# Patient Record
Sex: Male | Born: 1955 | Race: White | Hispanic: No | Marital: Married | Smoking: Never smoker
Health system: Southern US, Community
[De-identification: ages and names within clinical notes are randomized; demographics above are authoritative.]

---

## 2012-04-04 ENCOUNTER — Emergency Department: Payer: Self-pay | Admitting: Emergency Medicine

## 2013-11-26 IMAGING — CR RIGHT THUMB 2+V
1 series · 3 of 3 positions shown · non-contrast
Comparison: none

REASON FOR EXAM: lac
COMMENTS:

PROCEDURE:     DXR - DXR THUMB RIGHT HAND (1ST DIGIT)  - April 04, 2012  [DATE]
RESULT:     A non-displaced fracture is identified involving the distal tuft
of the thumb.

[Series 1: pa · 0.17mm/px · 3 of 3 slices shown]
[im 1/3]
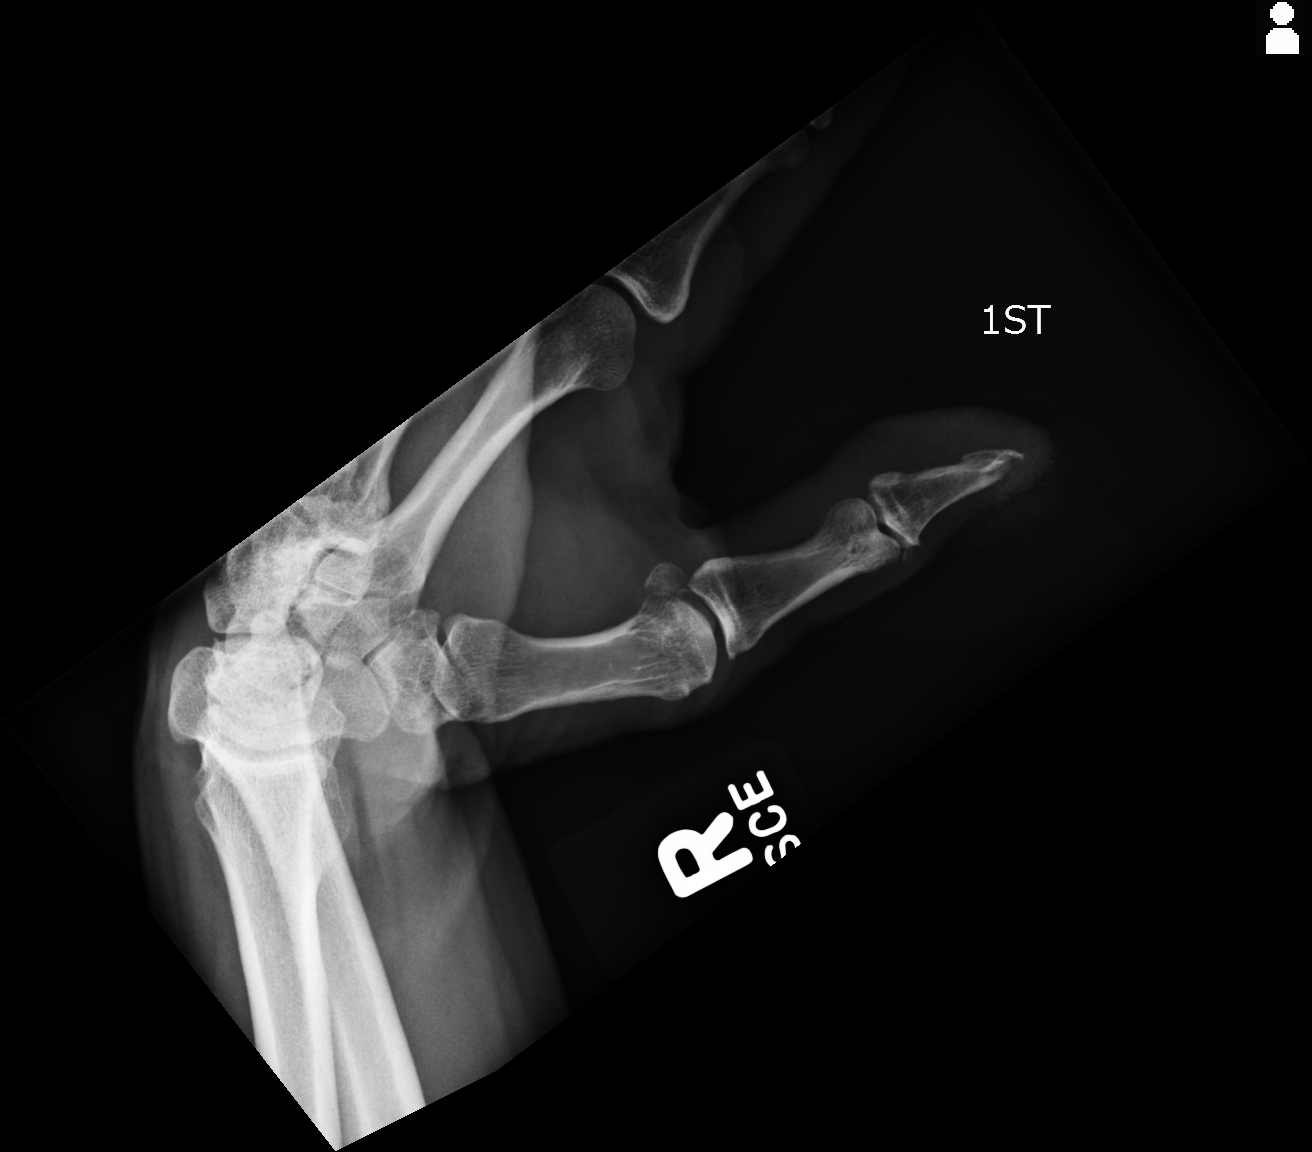
[im 2/3]
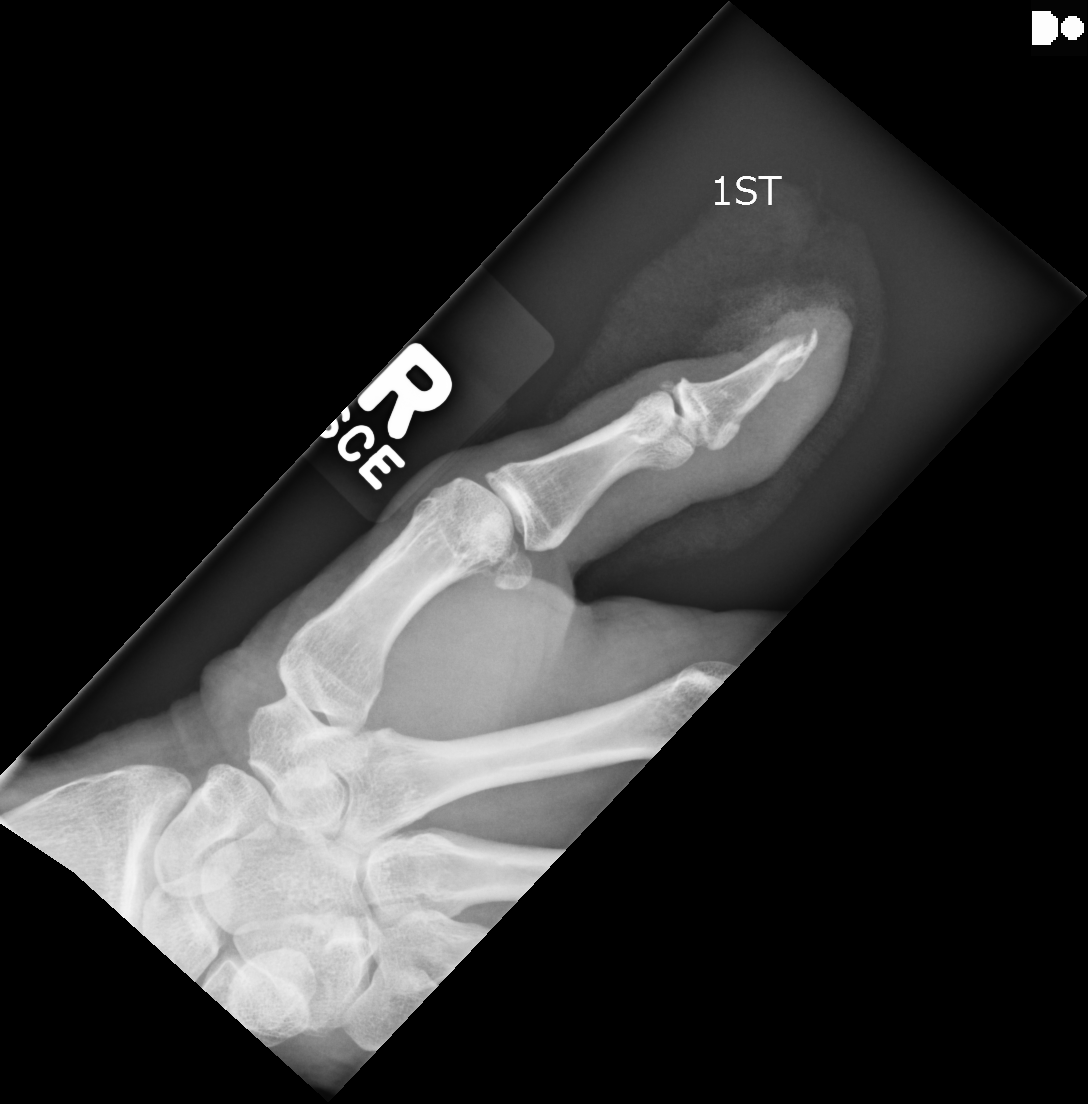
[im 3/3]
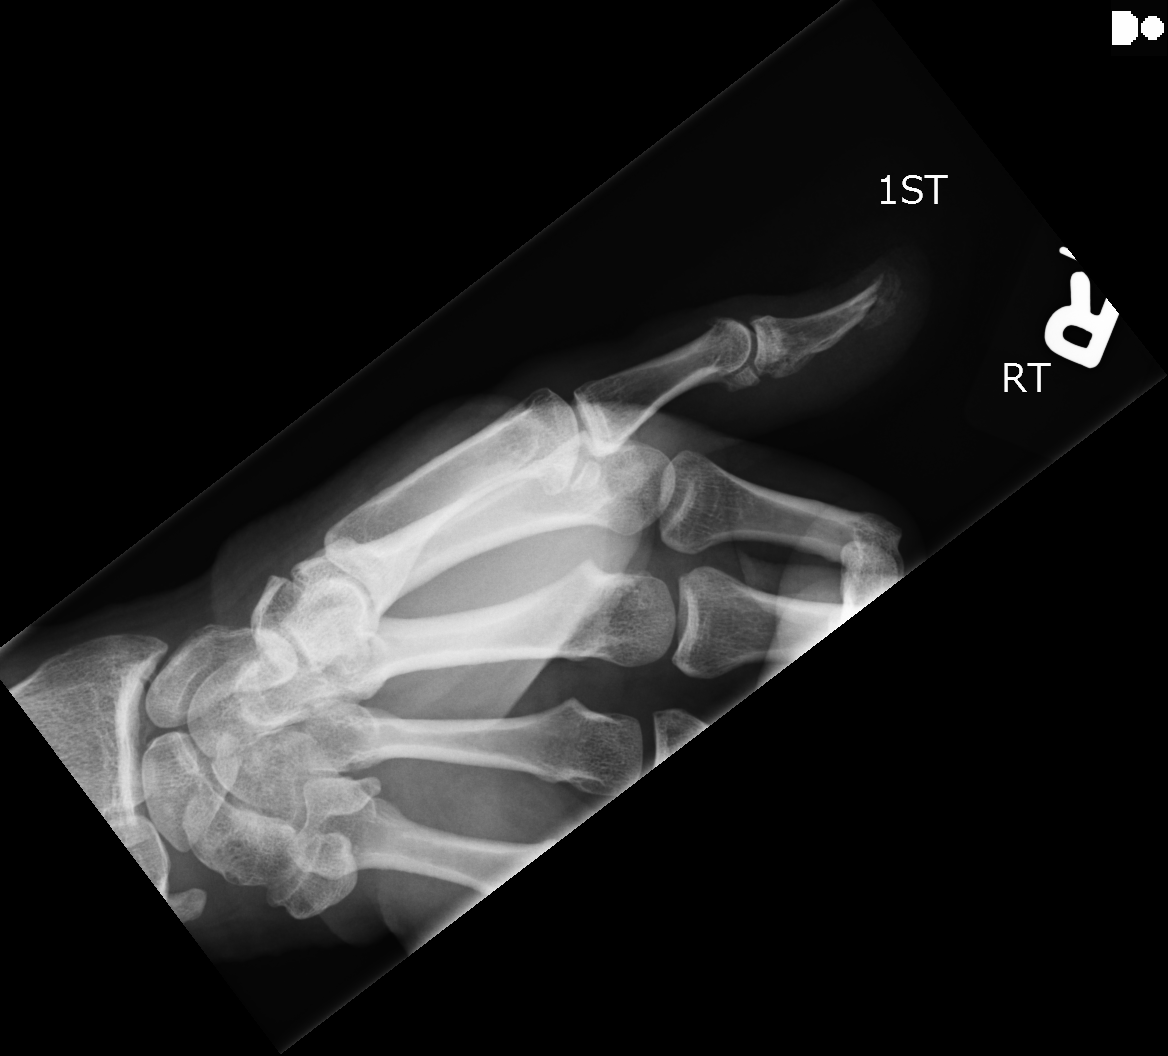

[3 of 3 positions shown; findings below may reference images not displayed]

IMPRESSION: Tuft fracture as described above.

## 2017-01-29 ENCOUNTER — Emergency Department: Payer: Managed Care, Other (non HMO)

## 2017-01-29 ENCOUNTER — Emergency Department
Admission: EM | Admit: 2017-01-29 | Discharge: 2017-01-29 | Disposition: A | Discharge status: Home / Self Care | Payer: Managed Care, Other (non HMO) | Attending: Emergency Medicine | Admitting: Emergency Medicine

## 2017-01-29 ENCOUNTER — Encounter: Payer: Self-pay | Admitting: Emergency Medicine

## 2017-01-29 DIAGNOSIS — Y998 Other external cause status: Secondary | ICD-10-CM | POA: Insufficient documentation

## 2017-01-29 DIAGNOSIS — S62522B Displaced fracture of distal phalanx of left thumb, initial encounter for open fracture: Secondary | ICD-10-CM | POA: Diagnosis not present

## 2017-01-29 DIAGNOSIS — W278XXA Contact with other nonpowered hand tool, initial encounter: Secondary | ICD-10-CM | POA: Insufficient documentation

## 2017-01-29 DIAGNOSIS — S61112A Laceration without foreign body of left thumb with damage to nail, initial encounter: Secondary | ICD-10-CM | POA: Diagnosis not present

## 2017-01-29 DIAGNOSIS — Y929 Unspecified place or not applicable: Secondary | ICD-10-CM | POA: Diagnosis not present

## 2017-01-29 DIAGNOSIS — S60932A Unspecified superficial injury of left thumb, initial encounter: Secondary | ICD-10-CM | POA: Diagnosis present

## 2017-01-29 DIAGNOSIS — Y9389 Activity, other specified: Secondary | ICD-10-CM | POA: Diagnosis not present

## 2017-01-29 MED ORDER — LIDOCAINE HCL (PF) 1 % IJ SOLN
5.0000 mL | Freq: Once | INTRAMUSCULAR | Status: AC
  Administered 2017-01-29: 5 mL via SUBCUTANEOUS

## 2017-01-29 MED ORDER — LIDOCAINE HCL (PF) 1 % IJ SOLN
INTRAMUSCULAR | Status: AC
  Filled 2017-01-29: qty 5

## 2017-01-29 MED ORDER — HYDROCODONE-ACETAMINOPHEN 5-325 MG PO TABS: 1.0000 | ORAL_TABLET | ORAL | 0 refills | Status: AC | PRN

## 2017-01-29 MED ORDER — HYDROCODONE-ACETAMINOPHEN 5-325 MG PO TABS
1.0000 | ORAL_TABLET | Freq: Once | ORAL | Status: AC
  Administered 2017-01-29: 1 via ORAL
  Filled 2017-01-29: qty 1

## 2017-01-29 MED ORDER — CEPHALEXIN 500 MG PO CAPS: 500.0000 mg | ORAL_CAPSULE | Freq: Two times a day (BID) | ORAL | 0 refills | Status: AC

## 2017-01-29 NOTE — ED Provider Notes (Signed)
[facility] Emergency Department Provider Note   ____________________________________________    I have reviewed the triage vital signs and the nursing notes.   HISTORY  Chief Complaint Laceration     HPI Selig Wampole is a 61 y.o. male who presents with complaints of an injury to his left thumb. Patient reports he was using a table saw to saw lumber and accidentally cut his left thumb. Patient is right-handed. Reports his tetanus is up-to-date. No other injuries reported. He reports he is able to move his left thumb some decreased sensation dorsally primarily. Patient is a Music therapist   History reviewed. No pertinent past medical history.  There are no active problems to display for this patient.   History reviewed. No pertinent surgical history.  Prior to Admission medications   Medication Sig Start Date End Date Taking? Authorizing Provider  cephALEXin (KEFLEX) 500 MG capsule Take 1 capsule (500 mg total) by mouth 2 (two) times daily. 01/29/17   Jene Every, MD  HYDROcodone-acetaminophen (NORCO/VICODIN) 5-325 MG tablet Take 1 tablet by mouth every 4 (four) hours as needed for moderate pain. 01/29/17   Jene Every, MD     Allergies Patient has no known allergies.  History reviewed. No pertinent family history.  Social History Social History  Substance Use Topics  . Smoking status: Never Smoker  . Smokeless tobacco: Never Used  . Alcohol use No    Review of Systems        Musculoskeletal: Injury to left thumb as above Skin: Laceration as above Neurological: Decreased sensation but normal strength    ____________________________________________   PHYSICAL EXAM:  VITAL SIGNS: ED Triage Vitals  Enc Vitals Group     BP 01/29/17 1323 128/78     Pulse Rate 01/29/17 1323 75     Resp 01/29/17 1323 18     Temp 01/29/17 1323 98.3 F (36.8 C)     Temp Source 01/29/17 1323 Oral     SpO2 01/29/17 1323 100 %     Weight  01/29/17 1323 81.6 kg (180 lb)     Height --      Head Circumference --      Peak Flow --      Pain Score 01/29/17 1421 6     Pain Loc --      Pain Edu? --      Excl. in GC? --     Constitutional: Alert and oriented. No acute distress. Pleasant and interactive Eyes: Conjunctivae are normal.  Head: Atraumatic.  Cardiovascular: Normal rate, regular rhythm.  Respiratory: Normal respiratory effort.  No retractions.  Musculoskeletal: Patient with macerated left thumb laceration extending along primarily the left side of his thumb but also the center of his nail partially. Bleeding is controlled. Germinal matrix intact. Tendon function appears normal Neurologic:  Normal speech and language.  ____________________________________________   LABS (all labs ordered are listed, but only abnormal results are displayed)  Labs Reviewed - No data to display ____________________________________________  EKG   ____________________________________________  RADIOLOGY  Comminuted left thumb fracture ____________________________________________   PROCEDURES  Procedure(s) performed:yes  LACERATION REPAIR Performed by: Jene Every Authorized by: Jene Every Consent: Verbal consent obtained. Risks and benefits: risks, benefits and alternatives were discussed Consent given by: patient Patient identity confirmed: provided demographic data Prepped and Draped in normal sterile fashion Wound explored  Laceration Location: left thumb  Laceration Length: 4 cm  No Foreign Bodies seen or palpated  Anesthesia: finger block  Local anesthetic: lidocaine  1%   Anesthetic total: 5 ml   Amount of cleaning: extensive, soaked in betadine solution  Skin closure: loose approximation  Number of sutures: 5  Technique: simple interrupted  Patient tolerance: Patient tolerated the procedure well with no immediate complications.    Critical Care performed:  No ____________________________________________   INITIAL IMPRESSION / ASSESSMENT AND PLAN / ED COURSE  Pertinent labs & imaging results that were available during my care of the patient were reviewed by me and considered in my medical decision making (see chart for details).  Patient with significant laceration and comminuted thumb fracture to the left thumb. Discussed with Dr. Rosita KeaMenz of orthopedics, who recommends loose approximation after thorough cleansing and he will see the patient in 2 days.   Patient's wife works for [employer] she would prefer to follow up with her physicians.  Keflex prophylaxis.   ____________________________________________   FINAL CLINICAL IMPRESSION(S) / ED DIAGNOSES  Final diagnoses:  Open displaced fracture of distal phalanx of left thumb, initial encounter      NEW MEDICATIONS STARTED DURING THIS VISIT:  New Prescriptions   CEPHALEXIN (KEFLEX) 500 MG CAPSULE    Take 1 capsule (500 mg total) by mouth 2 (two) times daily.   HYDROCODONE-ACETAMINOPHEN (NORCO/VICODIN) 5-325 MG TABLET    Take 1 tablet by mouth every 4 (four) hours as needed for moderate pain.     Note:  This document was prepared using Dragon voice recognition software and may include unintentional dictation errors.    Jene EveryKinner, Brisa Auth, MD 01/29/17 [phone]

## 2017-01-29 NOTE — ED Triage Notes (Signed)
Pt to ed with c/o left hand first digit pain and laceration from table saw today,  Pt with bleeding noted. +movement

## 2017-01-29 NOTE — ED Notes (Signed)
X-ray at bedside
